# Patient Record
Sex: Female | Born: 1980 | Hispanic: No | Marital: Married | Smoking: Never smoker
Health system: Southern US, Community
[De-identification: ages and names within clinical notes are randomized; demographics above are authoritative.]

## PROBLEM LIST (undated history)

## (undated) ENCOUNTER — Inpatient Hospital Stay (HOSPITAL_COMMUNITY): Payer: Self-pay

## (undated) DIAGNOSIS — O24419 Gestational diabetes mellitus in pregnancy, unspecified control: Secondary | ICD-10-CM

---

## 2013-07-08 ENCOUNTER — Emergency Department (HOSPITAL_COMMUNITY): Payer: Medicaid Other

## 2013-07-08 ENCOUNTER — Encounter (HOSPITAL_COMMUNITY): Payer: Self-pay | Admitting: Emergency Medicine

## 2013-07-08 ENCOUNTER — Emergency Department (HOSPITAL_COMMUNITY)
Admission: EM | Admit: 2013-07-08 | Discharge: 2013-07-08 | Discharge status: Against Medical Advice | Payer: Medicaid Other | Attending: Emergency Medicine | Admitting: Emergency Medicine

## 2013-07-08 DIAGNOSIS — Z8632 Personal history of gestational diabetes: Secondary | ICD-10-CM | POA: Insufficient documentation

## 2013-07-08 DIAGNOSIS — R0789 Other chest pain: Secondary | ICD-10-CM | POA: Insufficient documentation

## 2013-07-08 DIAGNOSIS — M549 Dorsalgia, unspecified: Secondary | ICD-10-CM | POA: Insufficient documentation

## 2013-07-08 DIAGNOSIS — O26899 Other specified pregnancy related conditions, unspecified trimester: Secondary | ICD-10-CM

## 2013-07-08 DIAGNOSIS — O9989 Other specified diseases and conditions complicating pregnancy, childbirth and the puerperium: Secondary | ICD-10-CM | POA: Insufficient documentation

## 2013-07-08 DIAGNOSIS — R079 Chest pain, unspecified: Secondary | ICD-10-CM | POA: Insufficient documentation

## 2013-07-08 DIAGNOSIS — R109 Unspecified abdominal pain: Secondary | ICD-10-CM | POA: Insufficient documentation

## 2013-07-08 DIAGNOSIS — N898 Other specified noninflammatory disorders of vagina: Secondary | ICD-10-CM

## 2013-07-08 HISTORY — DX: Gestational diabetes mellitus in pregnancy, unspecified control: O24.419

## 2013-07-08 LAB — COMPREHENSIVE METABOLIC PANEL
ALK PHOS: 64 U/L (ref 39–117)
ALT: 14 U/L (ref 0–35)
AST: 20 U/L (ref 0–37)
Albumin: 2.5 g/dL — ABNORMAL LOW (ref 3.5–5.2)
BUN: 5 mg/dL — ABNORMAL LOW (ref 6–23)
CHLORIDE: 104 meq/L (ref 96–112)
CO2: 18 meq/L — AB (ref 19–32)
Calcium: 8.8 mg/dL (ref 8.4–10.5)
Creatinine, Ser: 0.43 mg/dL — ABNORMAL LOW (ref 0.50–1.10)
Glucose, Bld: 123 mg/dL — ABNORMAL HIGH (ref 70–99)
POTASSIUM: 3.4 meq/L — AB (ref 3.7–5.3)
SODIUM: 138 meq/L (ref 137–147)
Total Bilirubin: <0.2 mg/dL — ABNORMAL LOW (ref 0.3–1.2)
Total Protein: 6.8 g/dL (ref 6.0–8.3)

## 2013-07-08 LAB — URINALYSIS, ROUTINE W REFLEX MICROSCOPIC
BILIRUBIN URINE: NEGATIVE
Glucose, UA: NEGATIVE mg/dL
Hgb urine dipstick: NEGATIVE
KETONES UR: NEGATIVE mg/dL
Leukocytes, UA: NEGATIVE
Nitrite: NEGATIVE
PH: 5.5 (ref 5.0–8.0)
Protein, ur: NEGATIVE mg/dL
Specific Gravity, Urine: 1.005 (ref 1.005–1.030)
Urobilinogen, UA: 0.2 mg/dL (ref 0.0–1.0)

## 2013-07-08 LAB — CBC WITH DIFFERENTIAL/PLATELET
Basophils Absolute: 0 10*3/uL (ref 0.0–0.1)
Basophils Relative: 0 % (ref 0–1)
Eosinophils Absolute: 0.1 10*3/uL (ref 0.0–0.7)
Eosinophils Relative: 1 % (ref 0–5)
HCT: 39 % (ref 36.0–46.0)
HEMOGLOBIN: 13.9 g/dL (ref 12.0–15.0)
LYMPHS ABS: 1.7 10*3/uL (ref 0.7–4.0)
Lymphocytes Relative: 22 % (ref 12–46)
MCH: 30.3 pg (ref 26.0–34.0)
MCHC: 35.6 g/dL (ref 30.0–36.0)
MCV: 85 fL (ref 78.0–100.0)
MONOS PCT: 6 % (ref 3–12)
Monocytes Absolute: 0.5 10*3/uL (ref 0.1–1.0)
NEUTROS ABS: 5.4 10*3/uL (ref 1.7–7.7)
Neutrophils Relative %: 71 % (ref 43–77)
Platelets: 197 10*3/uL (ref 150–400)
RBC: 4.59 MIL/uL (ref 3.87–5.11)
RDW: 13.6 % (ref 11.5–15.5)
WBC: 7.6 10*3/uL (ref 4.0–10.5)

## 2013-07-08 LAB — HCG, QUANTITATIVE, PREGNANCY: hCG, Beta Chain, Quant, S: 15273 m[IU]/mL — ABNORMAL HIGH (ref <5)

## 2013-07-08 MED ORDER — SODIUM CHLORIDE 0.9 % IV BOLUS (SEPSIS)
1000.0000 mL | Freq: Once | INTRAVENOUS | Status: AC
Start: 2013-07-08 — End: 2013-07-08
  Administered 2013-07-08: 1000 mL via INTRAVENOUS

## 2013-07-08 NOTE — ED Notes (Signed)
Pt c/o left chest pain, low back pain. Pt is approximately [redacted] weeks pregnant. LMP 8/25. Pt reports leaking clear fluid onset yesterday. Pt has not had any prenatal care for this pregnancy. This is pt 5th pregnancy.

## 2013-07-08 NOTE — ED Notes (Signed)
Pt and family states reports that they must leave due to child care issues at home. MD instructed patient to [facility] in the morning to have ultrasound performed. [facility] to have study performed. RN instructed patient to rest tonight and drink plenty of fluids. Pt verbalized understanding of d/c instructions.

## 2013-07-08 NOTE — Progress Notes (Signed)
Spoke with Dr. Macon LargeAnyanwu and told her that the pt has a LMP of Aug 25th 2014 which puts her at 20 5/7 weeks. Pt c/o leaking fluid, no uc's per pt or efm, no fluid seen on perineum. Pt c/o chest pain. Has nl EKG. Fhr 145 BPM, cbc with diff, cmp, drawn. Iv access obtained by ED staff, fluid bolus given. [facility]. Orders received for OB limited U/S to look at the amount of fluid around the baby. If it is normal, she can be dc'd home.

## 2013-07-08 NOTE — Progress Notes (Signed)
Pt c/o leaking fluid and just feeling wet. LMP in Aug. No prenatal care. States [place] this [place]. She says that this is her 5th pregnancy and that she has 3 living children. The last baby had some anomolies and died. Pt's perineum looks dry, but she says she has been feeling wet. No vaginal bleeding noted. Pt denies feeling uc's. Fhr 145BPM. Much fetal movement. Pt is 20 weeks and 5 days.

## 2013-07-08 NOTE — Discharge Instructions (Signed)
Abdominal Pain During Pregnancy °Abdominal pain is common in pregnancy. Most of the time, it does not cause harm. There are many causes of abdominal pain. Some causes are more serious than others. Some of the causes of abdominal pain in pregnancy are easily diagnosed. Occasionally, the diagnosis takes time to understand. Other times, the cause is not determined. Abdominal pain can be a sign that something is very wrong with the pregnancy, or the pain may have nothing to do with the pregnancy at all. For this reason, always tell your health care provider if you have any abdominal discomfort. °HOME CARE INSTRUCTIONS  °Monitor your abdominal pain for any changes. The following actions may help to alleviate any discomfort you are experiencing: °· Do not have sexual intercourse or put anything in your vagina until your symptoms go away completely. °· Get plenty of rest until your pain improves. °· Drink clear fluids if you feel nauseous. Avoid solid food as long as you are uncomfortable or nauseous. °· Only take over-the-counter or prescription medicine as directed by your health care provider. °· Keep all follow-up appointments with your health care provider. °SEEK IMMEDIATE MEDICAL CARE IF: °· You are bleeding, leaking fluid, or passing tissue from the vagina. °· You have increasing pain or cramping. °· You have persistent vomiting. °· You have painful or bloody urination. °· You have a fever. °· You notice a decrease in your baby's movements. °· You have extreme weakness or feel faint. °· You have shortness of breath, with or without abdominal pain. °· You develop a severe headache with abdominal pain. °· You have abnormal vaginal discharge with abdominal pain. °· You have persistent diarrhea. °· You have abdominal pain that continues even after rest, or gets worse. °MAKE SURE YOU:  °· Understand these instructions. °· Will watch your condition. °· Will get help right away if you are not doing well or get  worse. °Document Released: 06/08/2005 Document Revised: 03/29/2013 Document Reviewed: 01/05/2013 °ExitCare® Patient Information ©2014 [employer]. ° °

## 2013-07-08 NOTE — ED Notes (Signed)
OB RN at the bedside with fetal monitor.

## 2013-07-08 NOTE — ED Provider Notes (Signed)
CSN: [id number]     Arrival date & time 07/08/13  1532 History   First MD Initiated Contact with Patient 07/08/13 1603     Chief Complaint  Patient presents with  . Back Pain  . Chest Pain   (Consider location/radiation/quality/duration/timing/severity/associated sxs/prior Treatment) Patient is a 33 y.o. female presenting with chest pain and female genitourinary complaint. The history is provided by the patient.  Chest Pain Associated symptoms: no diaphoresis, no dizziness, no fever, no headache, no nausea, no numbness, no palpitations, no shortness of breath, not vomiting and no weakness   Female GU Problem This is a new problem. The current episode started yesterday. The problem occurs constantly. The problem has been unchanged. Pertinent negatives include no arthralgias, chest pain, chills, congestion, diaphoresis, fever, headaches, myalgias, nausea, neck pain, numbness, vomiting or weakness. Nothing aggravates the symptoms. She has tried nothing for the symptoms. The treatment provided no relief.    Past Medical History  Diagnosis Date  . Gestational diabetes    No past surgical history on file. No family history on file. History  Substance Use Topics  . Smoking status: Not on file  . Smokeless tobacco: Not on file  . Alcohol Use: Not on file   OB History   Grav Para Term Preterm Abortions TAB SAB Ect Mult Living   1              Review of Systems  Constitutional: Negative for fever, chills and diaphoresis.  HENT: Negative for congestion and rhinorrhea.   Eyes: Negative for redness and visual disturbance.  Respiratory: Negative for shortness of breath and wheezing.   Cardiovascular: Negative for chest pain and palpitations.  Gastrointestinal: Negative for nausea and vomiting.  Genitourinary: Positive for vaginal discharge (clear). Negative for dysuria and urgency.  Musculoskeletal: Negative for arthralgias, myalgias and neck pain.  Skin: Negative for pallor and wound.   Neurological: Negative for dizziness, weakness, numbness and headaches.    Allergies  Review of patient's allergies indicates no known allergies.  Home Medications   Current Outpatient Rx  Name  Route  Sig  Dispense  Refill  . Prenatal Vit-Fe Fumarate-FA (MULTIVITAMIN-PRENATAL) 27-0.8 MG TABS tablet   Oral   Take 1 tablet by mouth daily at 12 noon.          BP 114/85  Pulse 76  Temp(Src) 98.3 F (36.8 C) (Oral)  Resp 15  SpO2 100%  LMP 02/13/2013 Physical Exam  Constitutional: She is oriented to person, place, and time. She appears well-developed and well-nourished. No distress.  HENT:  Head: Normocephalic and atraumatic.  Eyes: EOM are normal. Pupils are equal, round, and reactive to light.  Neck: Normal range of motion. Neck supple.  Cardiovascular: Normal rate and regular rhythm.  Exam reveals no gallop and no friction rub.   No murmur heard. Pulmonary/Chest: Effort normal. She has no wheezes. She has no rales.  Abdominal: Soft. She exhibits no distension. There is no tenderness. There is no rebound and no guarding.  Gravid uterus   Musculoskeletal: She exhibits no edema and no tenderness.  Neurological: She is alert and oriented to person, place, and time.  Skin: Skin is warm and dry. She is not diaphoretic.  Psychiatric: She has a normal mood and affect. Her behavior is normal.    ED Course  Procedures (including critical care time) Labs Review Labs Reviewed  COMPREHENSIVE METABOLIC PANEL - Abnormal; Notable for the following:    Potassium 3.4 (*)    CO2 18 (*)  Glucose, Bld 123 (*)    BUN 5 (*)    Creatinine, Ser 0.43 (*)    Albumin 2.5 (*)    Total Bilirubin <0.2 (*)    All other components within normal limits  CBC WITH DIFFERENTIAL  HCG, QUANTITATIVE, PREGNANCY  URINALYSIS, ROUTINE W REFLEX MICROSCOPIC   Imaging Review No results found.  EKG Interpretation    Date/Time:  Saturday July 08 2013 16:06:30 EST Ventricular Rate:  80 PR  Interval:  135 QRS Duration: 85 QT Interval:  367 QTC Calculation: 423 R Axis:   78 Text Interpretation:  Sinus rhythm Normal ECG Confirmed by BEATON  MD, ROBERT ([id number]) on 07/08/2013 4:09:33 PM            MDM  No diagnosis found. Patient is a 33 y.o. female who presents with Back Pain and Chest Pain with leaking of clear fluid.  This started this started yesterday.  LMP per family 8/25.  [id number].  Last birth complicated with ancephaly and gestational DM.    OB nurse contacted, likely transfer.  Patient with fetal heart beat on bedside ultrasound, will obtain formal ultrasound to rule out oligohydraminos.    Family feeling impatient about having to stay so long for the ultrasound.  Reasoning for family discussed and given the option to follow up with their OB tomorrow for ultrasound.  Family would prefer to do that, no cramping in the ED, patient signed out AMA.   I have discussed the diagnosis/risks/treatment options with the patient and family and believe the pt to be eligible for discharge home to follow-up with OB. We also discussed returning to the ED immediately if new or worsening sx occur. We discussed the sx which are most concerning (e.g., worsening cramping, vaginal bleeding, change in vision, headache) that necessitate immediate return. Any new prescriptions provided to the patient are listed below.  Discharge Medication List as of 07/08/2013  8:59 PM          Melene Plan, MD 07/08/13 2231

## 2013-07-08 NOTE — Progress Notes (Signed)
Spoke with Dr. Macon LargeAnyanwu. U/S has not been done yet. States pt is cleared obstetrically and [facility] Spoke with Dr. Radford PaxBeaton. Gave him Dr. Mont DuttonAnyanwu's phone [phone] and told him to call her if the fluid level was abnormal, otherwise the pt could be dc'd home.

## 2013-07-09 ENCOUNTER — Inpatient Hospital Stay (HOSPITAL_COMMUNITY)
Admission: AD | Admit: 2013-07-09 | Discharge: 2013-07-09 | Disposition: A | Discharge status: Home / Self Care | Payer: Medicaid Other | Source: Ambulatory Visit | Attending: Obstetrics & Gynecology | Admitting: Obstetrics & Gynecology

## 2013-07-09 ENCOUNTER — Encounter (HOSPITAL_COMMUNITY): Payer: Self-pay | Admitting: Family

## 2013-07-09 ENCOUNTER — Inpatient Hospital Stay (HOSPITAL_COMMUNITY): Payer: Medicaid Other

## 2013-07-09 DIAGNOSIS — O0930 Supervision of pregnancy with insufficient antenatal care, unspecified trimester: Secondary | ICD-10-CM | POA: Insufficient documentation

## 2013-07-09 DIAGNOSIS — O99891 Other specified diseases and conditions complicating pregnancy: Secondary | ICD-10-CM | POA: Insufficient documentation

## 2013-07-09 DIAGNOSIS — O9989 Other specified diseases and conditions complicating pregnancy, childbirth and the puerperium: Principal | ICD-10-CM

## 2013-07-09 DIAGNOSIS — O0932 Supervision of pregnancy with insufficient antenatal care, second trimester: Secondary | ICD-10-CM

## 2013-07-09 DIAGNOSIS — M549 Dorsalgia, unspecified: Secondary | ICD-10-CM | POA: Insufficient documentation

## 2013-07-09 LAB — DIFFERENTIAL
Basophils Absolute: 0 10*3/uL (ref 0.0–0.1)
Basophils Relative: 0 % (ref 0–1)
EOS ABS: 0.1 10*3/uL (ref 0.0–0.7)
Eosinophils Relative: 1 % (ref 0–5)
LYMPHS ABS: 1.8 10*3/uL (ref 0.7–4.0)
Lymphocytes Relative: 24 % (ref 12–46)
MONOS PCT: 7 % (ref 3–12)
Monocytes Absolute: 0.5 10*3/uL (ref 0.1–1.0)
NEUTROS PCT: 69 % (ref 43–77)
Neutro Abs: 5.3 10*3/uL (ref 1.7–7.7)

## 2013-07-09 LAB — TYPE AND SCREEN
ABO/RH(D): O POS
ANTIBODY SCREEN: NEGATIVE

## 2013-07-09 LAB — ABO/RH: ABO/RH(D): O POS

## 2013-07-09 LAB — HEPATITIS B SURFACE ANTIGEN: Hepatitis B Surface Ag: NEGATIVE

## 2013-07-09 LAB — RPR: RPR Ser Ql: NONREACTIVE

## 2013-07-09 LAB — RAPID HIV SCREEN (WH-MAU): SUDS RAPID HIV SCREEN: NONREACTIVE

## 2013-07-09 MED ORDER — PRENATAL MULTIVITAMIN CH: 1.0000 | ORAL_TABLET | Freq: Every day | ORAL | Status: AC

## 2013-07-09 NOTE — Discharge Instructions (Signed)
Second Trimester of Pregnancy The second trimester is from week 13 through week 28, months 4 through 6. The second trimester is often a time when you feel your best. Your body has also adjusted to being pregnant, and you begin to feel better physically. Usually, morning sickness has lessened or quit completely, you may have more energy, and you may have an increase in appetite. The second trimester is also a time when the fetus is growing rapidly. At the end of the sixth month, the fetus is about 9 inches long and weighs about 1 pounds. You will likely begin to feel the baby move (quickening) between 18 and 20 weeks of the pregnancy. BODY CHANGES Your body goes through many changes during pregnancy. The changes vary from woman to woman.   Your weight will continue to increase. You will notice your lower abdomen bulging out.  You may begin to get stretch marks on your hips, abdomen, and breasts.  You may develop headaches that can be relieved by medicines approved by your caregiver.  You may urinate more often because the fetus is pressing on your bladder.  You may develop or continue to have heartburn as a result of your pregnancy.  You may develop constipation because certain hormones are causing the muscles that push waste through your intestines to slow down.  You may develop hemorrhoids or swollen, bulging veins (varicose veins).  You may have back pain because of the weight gain and pregnancy hormones relaxing your joints between the bones in your pelvis and as a result of a shift in weight and the muscles that support your balance.  Your breasts will continue to grow and be tender.  Your gums may bleed and may be sensitive to brushing and flossing.  Dark spots or blotches (chloasma, mask of pregnancy) may develop on your face. This will likely fade after the baby is born.  A dark line from your belly button to the pubic area (linea nigra) may appear. This will likely fade after the  baby is born. WHAT TO EXPECT AT YOUR PRENATAL VISITS During a routine prenatal visit:  You will be weighed to make sure you and the fetus are growing normally.  Your blood pressure will be taken.  Your abdomen will be measured to track your baby's growth.  The fetal heartbeat will be listened to.  Any test results from the previous visit will be discussed. Your caregiver may ask you:  How you are feeling.  If you are feeling the baby move.  If you have had any abnormal symptoms, such as leaking fluid, bleeding, severe headaches, or abdominal cramping.  If you have any questions. Other tests that may be performed during your second trimester include:  Blood tests that check for:  Low iron levels (anemia).  Gestational diabetes (between 24 and 28 weeks).  Rh antibodies.  Urine tests to check for infections, diabetes, or protein in the urine.  An ultrasound to confirm the proper growth and development of the baby.  An amniocentesis to check for possible genetic problems.  Fetal screens for spina bifida and Down syndrome. HOME CARE INSTRUCTIONS   Avoid all smoking, herbs, alcohol, and unprescribed drugs. These chemicals affect the formation and growth of the baby.  Follow your caregiver's instructions regarding medicine use. There are medicines that are either safe or unsafe to take during pregnancy.  Exercise only as directed by your caregiver. Experiencing uterine cramps is a good sign to stop exercising.  Continue to eat regular,   healthy meals.  Wear a good support bra for breast tenderness.  Do not use hot tubs, steam rooms, or saunas.  Wear your seat belt at all times when driving.  Avoid raw meat, uncooked cheese, cat litter boxes, and soil used by cats. These carry germs that can cause birth defects in the baby.  Take your prenatal vitamins.  Try taking a stool softener (if your caregiver approves) if you develop constipation. Eat more high-fiber foods,  such as fresh vegetables or fruit and whole grains. Drink plenty of fluids to keep your urine clear or pale yellow.  Take warm sitz baths to soothe any pain or discomfort caused by hemorrhoids. Use hemorrhoid cream if your caregiver approves.  If you develop varicose veins, wear support hose. Elevate your feet for 15 minutes, 3 4 times a day. Limit salt in your diet.  Avoid heavy lifting, wear low heel shoes, and practice good posture.  Rest with your legs elevated if you have leg cramps or low back pain.  Visit your dentist if you have not gone yet during your pregnancy. Use a soft toothbrush to brush your teeth and be gentle when you floss.  A sexual relationship may be continued unless your caregiver directs you otherwise.  Continue to go to all your prenatal visits as directed by your caregiver. SEEK MEDICAL CARE IF:   You have dizziness.  You have mild pelvic cramps, pelvic pressure, or nagging pain in the abdominal area.  You have persistent nausea, vomiting, or diarrhea.  You have a bad smelling vaginal discharge.  You have pain with urination. SEEK IMMEDIATE MEDICAL CARE IF:   You have a fever.  You are leaking fluid from your vagina.  You have spotting or bleeding from your vagina.  You have severe abdominal cramping or pain.  You have rapid weight gain or loss.  You have shortness of breath with chest pain.  You notice sudden or extreme swelling of your face, hands, ankles, feet, or legs.  You have not felt your baby move in over an hour.  You have severe headaches that do not go away with medicine.  You have vision changes. Document Released: 06/02/2001 Document Revised: 02/08/2013 Document Reviewed: 08/09/2012 ExitCare Patient Information 2014 [employer].  

## 2013-07-09 NOTE — MAU Note (Addendum)
33 yo, G5P3 at 6151w6d, [facility] were told to come here for follow-up. Patient denies pain, VB, contractions.  Patient has had no prenatal care to date. Report she has a clinic appointment scheduled for 2/6 at 0800.

## 2013-07-09 NOTE — MAU Provider Note (Signed)
None     Chief Complaint:  No chief complaint on file.   Jadi Cherry is  33 y.o. G5P4 at 1610w6d presents complaining of No chief complaint on file. presents for follow up of her ED visit yesterday. Pt was seen and evaluated for back pain and leakage of fluid x1 2 days ago that was clear per report without loss of fluid since. Pt back pain at baseline and unchanged. Pt with hx of oligohydramnios at the end of last pregnancy and is very concerned about fluid status.   [id number]  Obstetrical/Gynecological History: OB History   Grav Para Term Preterm Abortions TAB SAB Ect Mult Living   5 4        3      Past Medical History: Past Medical History  Diagnosis Date  . Gestational diabetes     Past Surgical History: Past Surgical History  Procedure Laterality Date  . Cesarean section      Family History: History reviewed. No pertinent family history.  Social History: History  Substance Use Topics  . Smoking status: Never Smoker   . Smokeless tobacco: Never Used  . Alcohol Use: No    Allergies: No Known Allergies  Meds:  Prescriptions prior to admission  Medication Sig Dispense Refill  . [DISCONTINUED] Prenatal Vit-Fe Fumarate-FA (PRENATAL MULTIVITAMIN) TABS tablet Take 1 tablet by mouth daily at 12 noon.        Review of Systems -   Review of Systems  No ctx, ?lof 2days ago, no vb, no ctx. No f/c, sob, n/v, d/c, weakness, cp, or other complaints.   Physical Exam  Blood pressure 113/67, pulse 82, temperature 97.9 F (36.6 C), temperature source Oral, resp. rate 17, last menstrual period 02/13/2013. GENERAL: Well-developed, well-nourished female in no acute distress.  LUNGS: Clear to auscultation bilaterally.  HEART: Regular rate and rhythm. ABDOMEN: Soft, nontender, nondistended, gravid.  EXTREMITIES: Nontender, no edema, 2+ distal pulses.  SSE: minimal white discharge. No bleeding. No evidence of LOF  Presentation:  transverse FHT:  Baseline rate 154 bpm doppler Bedside US with appropriate appearing fluid, transverse infant, FHT 157 dates  Labs: Results for orders placed during the hospital encounter of 07/09/13 (from the past 24 hour(s))  DIFFERENTIAL   Collection Time    07/09/13  1:47 PM      Result Value Range   Neutrophils Relative % 69  43 - 77 %   Neutro Abs 5.3  1.7 - 7.7 K/uL   Lymphocytes Relative 24  12 - 46 %   Lymphs Abs 1.8  0.7 - 4.0 K/uL   Monocytes Relative 7  3 - 12 %   Monocytes Absolute 0.5  0.1 - 1.0 K/uL   Eosinophils Relative 1  0 - 5 %   Eosinophils Absolute 0.1  0.0 - 0.7 K/uL   Basophils Relative 0  0 - 1 %   Basophils Absolute 0.0  0.0 - 0.1 K/uL  RAPID HIV SCREEN [facility])   Collection Time    07/09/13  1:47 PM      Result Value Range   SUDS Rapid HIV Screen NON REACTIVE  NON REACTIVE  TYPE AND SCREEN   Collection Time    07/09/13  1:47 PM      Result Value Range   ABO/RH(D) O POS     Antibody Screen PENDING     Sample Expiration 07/12/2013    Results for orders placed during the hospital encounter of 07/08/13 (  from the past 24 hour(s))  CBC WITH DIFFERENTIAL   Collection Time    07/08/13  4:10 PM      Result Value Range   WBC 7.6  4.0 - 10.5 K/uL   RBC 4.59  3.87 - 5.11 MIL/uL   Hemoglobin 13.9  12.0 - 15.0 g/dL   HCT 16.1  09.6 - 04.5 %   MCV 85.0  78.0 - 100.0 fL   MCH 30.3  26.0 - 34.0 pg   MCHC 35.6  30.0 - 36.0 g/dL   RDW 40.9  81.1 - 91.4 %   Platelets 197  150 - 400 K/uL   Neutrophils Relative % 71  43 - 77 %   Neutro Abs 5.4  1.7 - 7.7 K/uL   Lymphocytes Relative 22  12 - 46 %   Lymphs Abs 1.7  0.7 - 4.0 K/uL   Monocytes Relative 6  3 - 12 %   Monocytes Absolute 0.5  0.1 - 1.0 K/uL   Eosinophils Relative 1  0 - 5 %   Eosinophils Absolute 0.1  0.0 - 0.7 K/uL   Basophils Relative 0  0 - 1 %   Basophils Absolute 0.0  0.0 - 0.1 K/uL  COMPREHENSIVE METABOLIC PANEL   Collection Time    07/08/13  4:10 PM      Result Value Range   Sodium 138   137 - 147 mEq/L   Potassium 3.4 (*) 3.7 - 5.3 mEq/L   Chloride 104  96 - 112 mEq/L   CO2 18 (*) 19 - 32 mEq/L   Glucose, Bld 123 (*) 70 - 99 mg/dL   BUN 5 (*) 6 - 23 mg/dL   Creatinine, Ser 7.82 (*) 0.50 - 1.10 mg/dL   Calcium 8.8  8.4 - 95.6 mg/dL   Total Protein 6.8  6.0 - 8.3 g/dL   Albumin 2.5 (*) 3.5 - 5.2 g/dL   AST 20  0 - 37 U/L   ALT 14  0 - 35 U/L   Alkaline Phosphatase 64  39 - 117 U/L   Total Bilirubin <0.2 (*) 0.3 - 1.2 mg/dL   GFR calc non Af Amer >90  >90 mL/min   GFR calc Af Amer >90  >90 mL/min  HCG, QUANTITATIVE, PREGNANCY   Collection Time    07/08/13  4:16 PM      Result Value Range   hCG, Beta Chain, Quant, S 15273 (*) <5 mIU/mL  URINALYSIS, ROUTINE W REFLEX MICROSCOPIC   Collection Time    07/08/13  7:34 PM      Result Value Range   Color, Urine YELLOW  YELLOW   APPearance CLEAR  CLEAR   Specific Gravity, Urine 1.005  1.005 - 1.030   pH 5.5  5.0 - 8.0   Glucose, UA NEGATIVE  NEGATIVE mg/dL   Hgb urine dipstick NEGATIVE  NEGATIVE   Bilirubin Urine NEGATIVE  NEGATIVE   Ketones, ur NEGATIVE  NEGATIVE mg/dL   Protein, ur NEGATIVE  NEGATIVE mg/dL   Urobilinogen, UA 0.2  0.0 - 1.0 mg/dL   Nitrite NEGATIVE  NEGATIVE   Leukocytes, UA NEGATIVE  NEGATIVE   Imaging Studies:  No results found.  Assessment: Cleaster Folta is  33 y.o. G5P4 at [redacted]w[redacted]d presents with back pain likely related to pregnancy and ?LOF several days ago. Bedside US reassuring, but evaluated with formal limited US because of plan from ED. Formal US reassuring. Otherwise no acute issues..  Plan: Pt to follow up with clinic on  3Feb, PNL drawn today, US ob 14+ coordinated today as well. PTL Precautions reviewed. Discharged home.  Jolyn Lent RYAN 1/18/20152:27 PM

## 2013-07-10 LAB — GC/CHLAMYDIA PROBE AMP
CT PROBE, AMP APTIMA: NEGATIVE
GC PROBE AMP APTIMA: NEGATIVE

## 2013-07-12 NOTE — ED Provider Notes (Signed)
I saw and evaluated the patient, reviewed the resident's note and I agree with the findings and plan.   .Face to face Exam:  General:  Awake HEENT:  Atraumatic Resp:  Normal effort Abd:  Nondistended Neuro:No focal weakness   Nelia Shiobert L Bird Tailor, MD 07/12/13 [phone]

## 2013-07-31 ENCOUNTER — Encounter: Payer: Self-pay | Admitting: Family Medicine

## 2013-08-07 ENCOUNTER — Other Ambulatory Visit (HOSPITAL_COMMUNITY)
Admission: RE | Admit: 2013-08-07 | Discharge: 2013-08-07 | Disposition: A | Discharge status: Home / Self Care | Payer: Medicaid Other | Source: Ambulatory Visit | Attending: Obstetrics & Gynecology | Admitting: Obstetrics & Gynecology

## 2013-08-07 ENCOUNTER — Encounter: Payer: Self-pay | Admitting: Obstetrics & Gynecology

## 2013-08-07 ENCOUNTER — Ambulatory Visit (INDEPENDENT_AMBULATORY_CARE_PROVIDER_SITE_OTHER): Payer: Medicaid Other | Admitting: Obstetrics & Gynecology

## 2013-08-07 VITALS — BP 119/82 | Temp 98.8°F | Ht 63.0 in | Wt 210.4 lb

## 2013-08-07 DIAGNOSIS — Z23 Encounter for immunization: Secondary | ICD-10-CM

## 2013-08-07 DIAGNOSIS — O2390 Unspecified genitourinary tract infection in pregnancy, unspecified trimester: Secondary | ICD-10-CM

## 2013-08-07 DIAGNOSIS — Z01419 Encounter for gynecological examination (general) (routine) without abnormal findings: Secondary | ICD-10-CM | POA: Insufficient documentation

## 2013-08-07 DIAGNOSIS — N390 Urinary tract infection, site not specified: Secondary | ICD-10-CM

## 2013-08-07 DIAGNOSIS — O34219 Maternal care for unspecified type scar from previous cesarean delivery: Secondary | ICD-10-CM

## 2013-08-07 DIAGNOSIS — O2342 Unspecified infection of urinary tract in pregnancy, second trimester: Secondary | ICD-10-CM

## 2013-08-07 DIAGNOSIS — Z113 Encounter for screening for infections with a predominantly sexual mode of transmission: Secondary | ICD-10-CM | POA: Insufficient documentation

## 2013-08-07 DIAGNOSIS — O0930 Supervision of pregnancy with insufficient antenatal care, unspecified trimester: Secondary | ICD-10-CM

## 2013-08-07 DIAGNOSIS — Z1151 Encounter for screening for human papillomavirus (HPV): Secondary | ICD-10-CM | POA: Insufficient documentation

## 2013-08-07 LAB — POCT URINALYSIS DIP (DEVICE)
BILIRUBIN URINE: NEGATIVE
Glucose, UA: NEGATIVE mg/dL
Hgb urine dipstick: NEGATIVE
Ketones, ur: NEGATIVE mg/dL
LEUKOCYTES UA: NEGATIVE
NITRITE: NEGATIVE
Protein, ur: NEGATIVE mg/dL
Specific Gravity, Urine: 1.02 (ref 1.005–1.030)
UROBILINOGEN UA: 0.2 mg/dL (ref 0.0–1.0)
pH: 7 (ref 5.0–8.0)

## 2013-08-07 LAB — TSH: TSH: 1.102 u[IU]/mL (ref 0.350–4.500)

## 2013-08-07 LAB — HIV ANTIBODY (ROUTINE TESTING W REFLEX): HIV: NONREACTIVE

## 2013-08-07 MED ORDER — TETANUS-DIPHTH-ACELL PERTUSSIS 5-2.5-18.5 LF-MCG/0.5 IM SUSP: 0.5000 mL | Freq: Once | INTRAMUSCULAR | Status: AC

## 2013-08-07 NOTE — Progress Notes (Signed)
   Subjective:    Erica HillockHiba Black is Black 33 y.o. [id number] at 3328w0d, by LMP consistent with 19 week scan, being seen today for her first obstetrical visit.  Her obstetrical history is significant for two previous cesarean sections; last cesarean delivery was at 36 weeks for Black baby with Down's syndrome and oligohydramnios; delivery was done for worsening oligohydramnios. Infant dies at 343 months of age.  History of GDM, will get 1 hr GTT today.  Patient desires repeat cesarean section. Patient does intend to breast feed. Pregnancy history fully reviewed.  Patient reports no complaints.  Filed Vitals:   08/07/13 0916 08/07/13 0936  BP: 119/82   Temp: 98.8 F (37.1 C)   Height:  5\' 3"  (1.6 m)  Weight: 210 lb 6.4 oz (95.437 kg)     HISTORY: OB History  Gravida Para Term Preterm AB SAB TAB Ectopic Multiple Living  5 4 3 1      4     # Outcome Date GA Lbr Len/2nd Weight Sex Delivery Anes PTL Lv  5 CUR           4 PRE 08/08/12 [redacted]w[redacted]d  4 lb 6.6 oz (2 kg) M LTCS Gen  N     Comments: baby died at 183 mos old; olihydraminos; baby had down's syndrome  3 TRM 12/19/07 2431w0d  5 lb 8.2 oz (2.5 kg) F LTCS Gen  Y     Comments: malpositioned and postdates  2 TRM 05/11/06 7643w0d  6 lb 9.8 oz (3 kg) M SVD None  Y  1 TRM 02/25/04 6943w0d  6 lb 9.8 oz (3 kg) M SVD None  Y     Past Medical History  Diagnosis Date  . Gestational diabetes    Past Surgical History  Procedure Laterality Date  . Cesarean section     History reviewed. No pertinent family history.   Exam   BP 119/82  Temp(Src) 98.8 F (37.1 C)  Ht 5\' 3"  (1.6 m)  Wt 210 lb 6.4 oz (95.437 kg)  BMI 37.28 kg/m2  LMP 02/13/2013 GENERAL: Well-developed, well-nourished female in no acute distress.  HEENT: Normocephalic, atraumatic. Sclerae anicteric.  NECK: Supple. Normal thyroid.  LUNGS: Clear to auscultation bilaterally.  HEART: Regular rate and rhythm. BREASTS: Symmetric in size. No masses, skin changes, nipple drainage, or  lymphadenopathy. ABDOMEN: Soft, nontender, gravid. No organomegaly. 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 Normal introitus, able to accomodate Graves speculum easily.  Vagina is pink and rugated.  Normal discharge. Normal cervix contour. Pap smear obtained. Uterus is 25 weeks in size, nontender. No adnexal mass or tenderness.  EXTREMITIES: No cyanosis, clubbing, or edema, 2+ distal pulses.    Assessment:    Pregnancy: [id number] Patient Active Problem List   Diagnosis Date Noted  . Late prenatal care 08/07/2013  . Previous cesarean delivery x2, antepartum 08/07/2013     Plan:   Initial labs drawn.  Flu and Tdap vaccines given. 1 hr GTT also done today, history of GDM. Continue prenatal vitamins. Patient counseled about RCS vs VBAC; risks reviewed. She desires RCS, will be scheduled at 39 weeks or earlier if indicated Problem list reviewed and updated. Ultrasound discussed; fetal survey: ordered. Follow up in 3 weeks, fetal movement and labor precautions reviewed.   Erica Black,Erica Black, M.D. 08/07/2013

## 2013-08-07 NOTE — Patient Instructions (Signed)
Return to clinic for any obstetric concerns or go to [facility] for evaluation  

## 2013-08-07 NOTE — Progress Notes (Signed)
U/S scheduled 08/11/13 at 9 am. [facility] Patient to get letter from check out to take to her appt.

## 2013-08-07 NOTE — Progress Notes (Signed)
Pulse- 84 Edema-feet Early glucola given for BMI Pt reports having sensitivity with hot/cold things, gums bleed New ob packet given Tdap and flu vaccine consented and info given

## 2013-08-08 DIAGNOSIS — O09219 Supervision of pregnancy with history of pre-term labor, unspecified trimester: Secondary | ICD-10-CM

## 2013-08-08 DIAGNOSIS — O34219 Maternal care for unspecified type scar from previous cesarean delivery: Secondary | ICD-10-CM

## 2013-08-08 DIAGNOSIS — O0930 Supervision of pregnancy with insufficient antenatal care, unspecified trimester: Secondary | ICD-10-CM

## 2013-08-08 LAB — OBSTETRIC PANEL
Antibody Screen: NEGATIVE
BASOS ABS: 0 10*3/uL (ref 0.0–0.1)
BASOS PCT: 0 % (ref 0–1)
Eosinophils Absolute: 0.1 10*3/uL (ref 0.0–0.7)
Eosinophils Relative: 1 % (ref 0–5)
HCT: 40.8 % (ref 36.0–46.0)
Hemoglobin: 14.1 g/dL (ref 12.0–15.0)
Hepatitis B Surface Ag: NEGATIVE
Lymphocytes Relative: 27 % (ref 12–46)
Lymphs Abs: 2.1 10*3/uL (ref 0.7–4.0)
MCH: 29.6 pg (ref 26.0–34.0)
MCHC: 34.6 g/dL (ref 30.0–36.0)
MCV: 85.5 fL (ref 78.0–100.0)
Monocytes Absolute: 0.4 10*3/uL (ref 0.1–1.0)
Monocytes Relative: 5 % (ref 3–12)
NEUTROS ABS: 5.1 10*3/uL (ref 1.7–7.7)
Neutrophils Relative %: 67 % (ref 43–77)
PLATELETS: 211 10*3/uL (ref 150–400)
RBC: 4.77 MIL/uL (ref 3.87–5.11)
RDW: 15.2 % (ref 11.5–15.5)
Rh Type: POSITIVE
Rubella: 2.91 Index — ABNORMAL HIGH (ref <0.90)
WBC: 7.6 10*3/uL (ref 4.0–10.5)

## 2013-08-09 LAB — PRESCRIPTION MONITORING PROFILE (19 PANEL)
AMPHETAMINE/METH: NEGATIVE ng/mL
BUPRENORPHINE, URINE: NEGATIVE ng/mL
Barbiturate Screen, Urine: NEGATIVE ng/mL
Benzodiazepine Screen, Urine: NEGATIVE ng/mL
Cannabinoid Scrn, Ur: NEGATIVE ng/mL
Carisoprodol, Urine: NEGATIVE ng/mL
Cocaine Metabolites: NEGATIVE ng/mL
Creatinine, Urine: 135.71 mg/dL (ref >20.0)
Fentanyl, Ur: NEGATIVE ng/mL
MDMA URINE: NEGATIVE ng/mL
MEPERIDINE UR: NEGATIVE ng/mL
METHAQUALONE SCREEN (URINE): NEGATIVE ng/mL
Methadone Screen, Urine: NEGATIVE ng/mL
Nitrites, Initial: NEGATIVE ug/mL
Opiate Screen, Urine: NEGATIVE ng/mL
Oxycodone Screen, Ur: NEGATIVE ng/mL
PH URINE, INITIAL: 7.4 pH (ref 4.5–8.9)
PROPOXYPHENE: NEGATIVE ng/mL
Phencyclidine, Ur: NEGATIVE ng/mL
TAPENTADOLUR: NEGATIVE ng/mL
TRAMADOL UR: NEGATIVE ng/mL
Zolpidem, Urine: NEGATIVE ng/mL

## 2013-08-09 LAB — GLUCOSE TOLERANCE, 1 HOUR (50G) W/O FASTING: GLUCOSE 1 HOUR GTT: 170 mg/dL — AB (ref 70–140)

## 2013-08-10 ENCOUNTER — Encounter: Payer: Self-pay | Admitting: Obstetrics & Gynecology

## 2013-08-10 DIAGNOSIS — O2342 Unspecified infection of urinary tract in pregnancy, second trimester: Secondary | ICD-10-CM | POA: Insufficient documentation

## 2013-08-10 MED ORDER — SULFAMETHOXAZOLE-TMP DS 800-160 MG PO TABS: 1.0000 | ORAL_TABLET | Freq: Two times a day (BID) | ORAL | Status: AC

## 2013-08-10 NOTE — Addendum Note (Signed)
Addended by: Jaynie CollinsANYANWU, Jarad Barth A on: 08/10/2013 06:24 PM   Modules accepted: Orders

## 2013-08-11 ENCOUNTER — Other Ambulatory Visit (HOSPITAL_COMMUNITY): Payer: Self-pay | Admitting: Family Medicine

## 2013-08-11 ENCOUNTER — Ambulatory Visit (HOSPITAL_COMMUNITY)
Admission: RE | Admit: 2013-08-11 | Discharge: 2013-08-11 | Disposition: A | Discharge status: Home / Self Care | Payer: Medicaid Other | Source: Ambulatory Visit | Attending: Family Medicine | Admitting: Family Medicine

## 2013-08-11 ENCOUNTER — Encounter: Payer: Self-pay | Admitting: Family Medicine

## 2013-08-11 DIAGNOSIS — Z363 Encounter for antenatal screening for malformations: Secondary | ICD-10-CM | POA: Insufficient documentation

## 2013-08-11 DIAGNOSIS — O34219 Maternal care for unspecified type scar from previous cesarean delivery: Secondary | ICD-10-CM | POA: Insufficient documentation

## 2013-08-11 DIAGNOSIS — Z1389 Encounter for screening for other disorder: Secondary | ICD-10-CM | POA: Insufficient documentation

## 2013-08-11 DIAGNOSIS — O09299 Supervision of pregnancy with other poor reproductive or obstetric history, unspecified trimester: Secondary | ICD-10-CM | POA: Insufficient documentation

## 2013-08-11 DIAGNOSIS — O0932 Supervision of pregnancy with insufficient antenatal care, second trimester: Secondary | ICD-10-CM

## 2013-08-11 DIAGNOSIS — O0930 Supervision of pregnancy with insufficient antenatal care, unspecified trimester: Secondary | ICD-10-CM | POA: Insufficient documentation

## 2013-08-11 DIAGNOSIS — O358XX0 Maternal care for other (suspected) fetal abnormality and damage, not applicable or unspecified: Secondary | ICD-10-CM | POA: Insufficient documentation

## 2013-08-13 LAB — CULTURE, OB URINE: Colony Count: >=100000

## 2013-08-14 ENCOUNTER — Telehealth: Payer: Self-pay

## 2013-08-14 NOTE — Telephone Encounter (Signed)
Message copied by Faythe CasaBELLAMY, Keeanna Villafranca M on Mon Aug 14, 2013  3:28 PM ------      Message from: Jaynie CollinsANYANWU, UGONNA A      Created: Thu Aug 10, 2013  6:24 PM       Bactrim prescribed for UTI.  Please call to inform patient of results and advise her to pick up prescription.        ------

## 2013-08-28 ENCOUNTER — Encounter: Payer: Self-pay | Admitting: Family Medicine

## 2013-08-28 ENCOUNTER — Encounter: Payer: Medicaid Other | Admitting: Family Medicine

## 2013-08-29 ENCOUNTER — Encounter: Payer: Self-pay | Admitting: *Deleted

## 2013-10-16 ENCOUNTER — Encounter: Payer: Self-pay | Admitting: *Deleted

## 2013-11-15 ENCOUNTER — Encounter: Payer: Self-pay | Admitting: General Practice

## 2014-04-23 ENCOUNTER — Encounter: Payer: Self-pay | Admitting: Obstetrics & Gynecology

## 2014-05-14 ENCOUNTER — Encounter (HOSPITAL_COMMUNITY): Payer: Self-pay | Admitting: *Deleted

## 2014-07-23 IMAGING — US US OB DETAIL+14 WK
1 series · 12 of 28 positions shown · non-contrast
Comparison: none

[Series 1: us ob comp +14 wk · 12 of 84 slices shown]
[im 4/84]
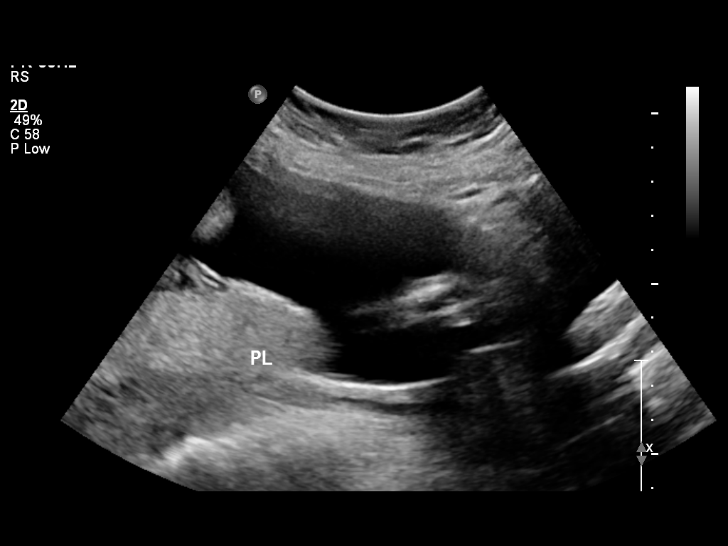
[im 10/84]
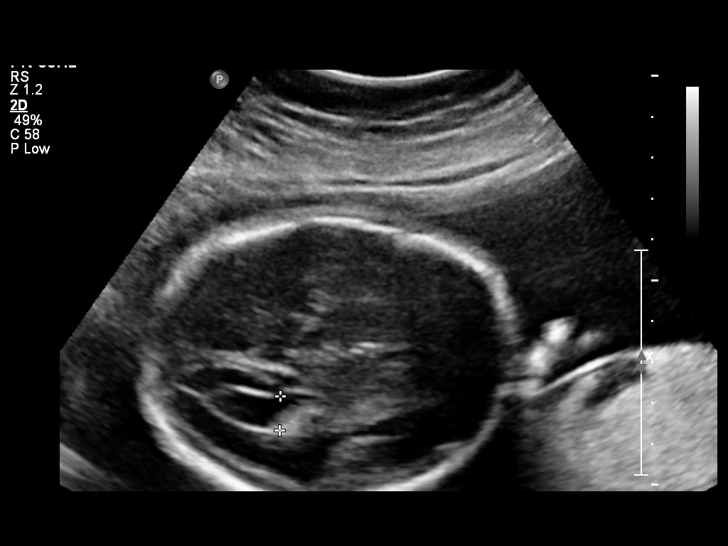
[im 16/84]
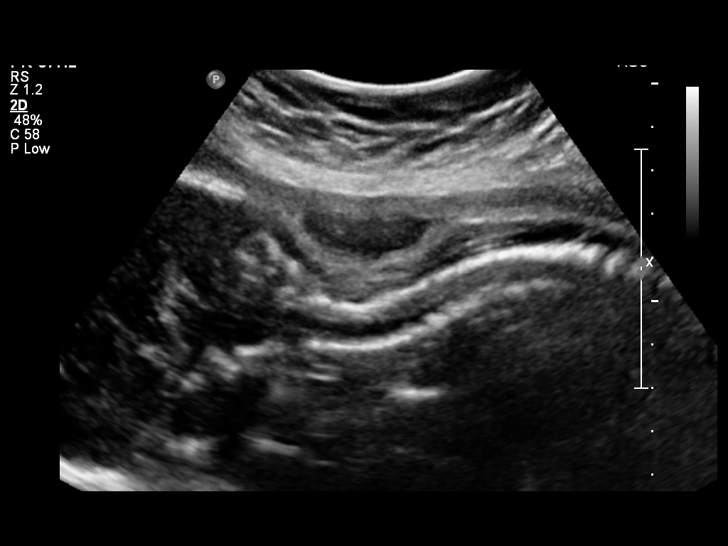
[im 25/84]
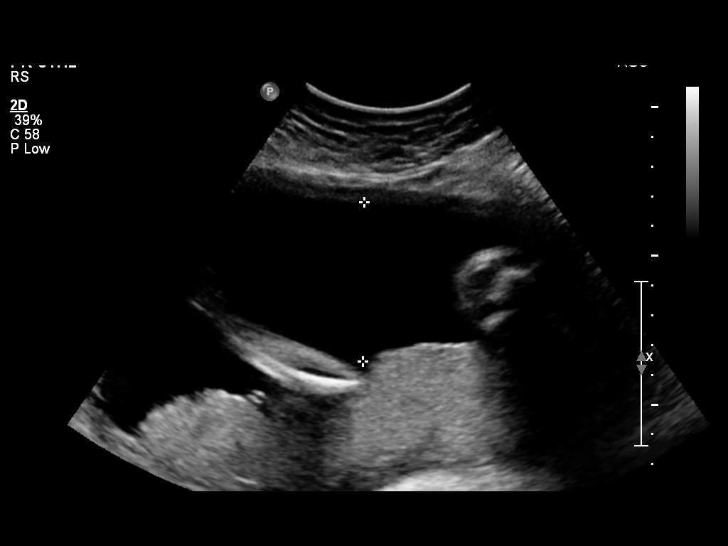
[im 31/84]
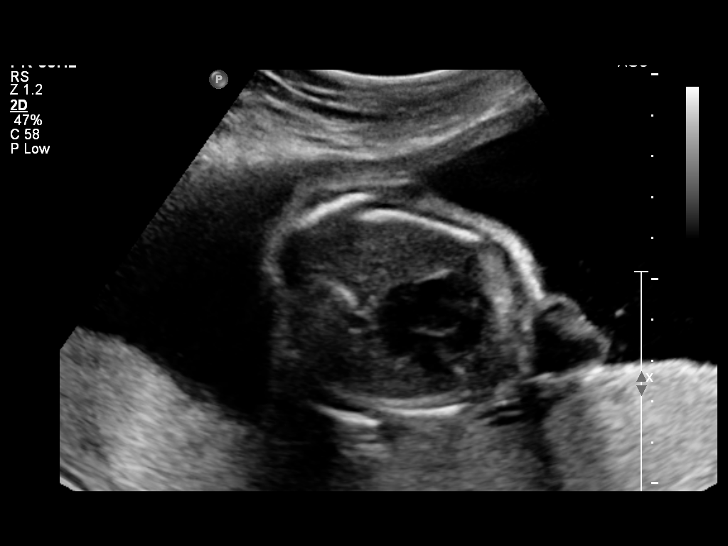
[im 37/84]
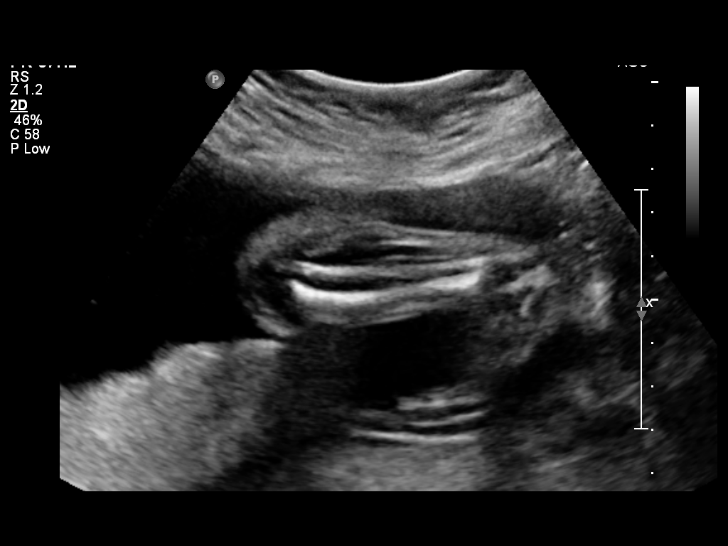
[im 47/84]
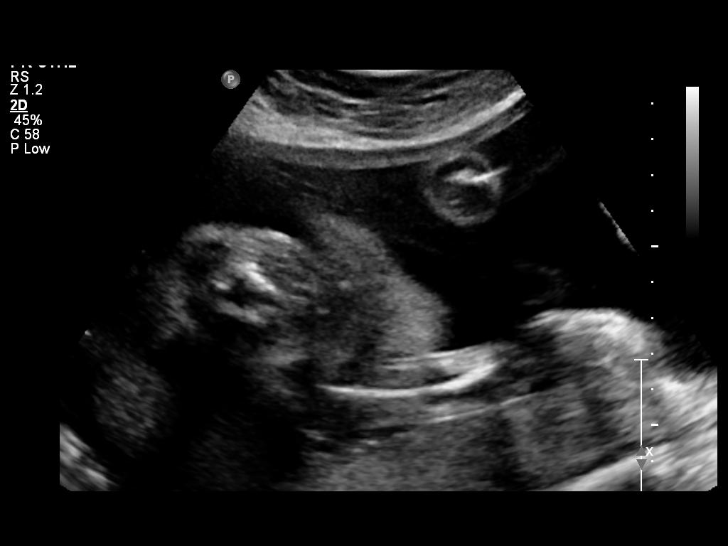
[im 53/84]
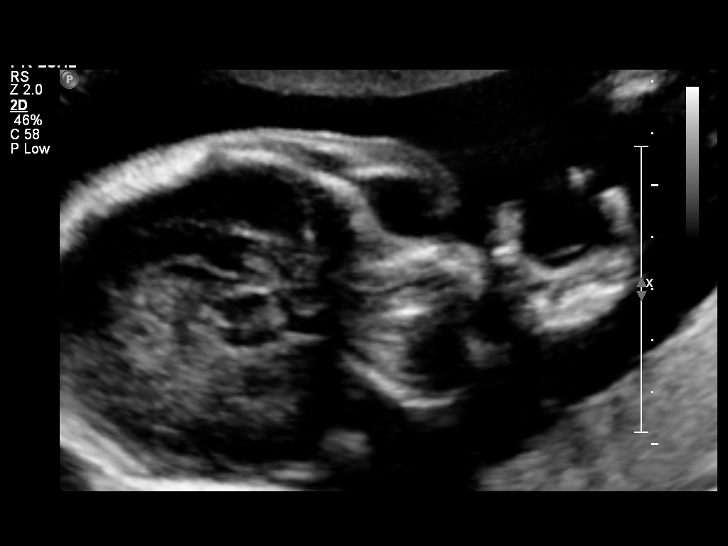
[im 59/84]
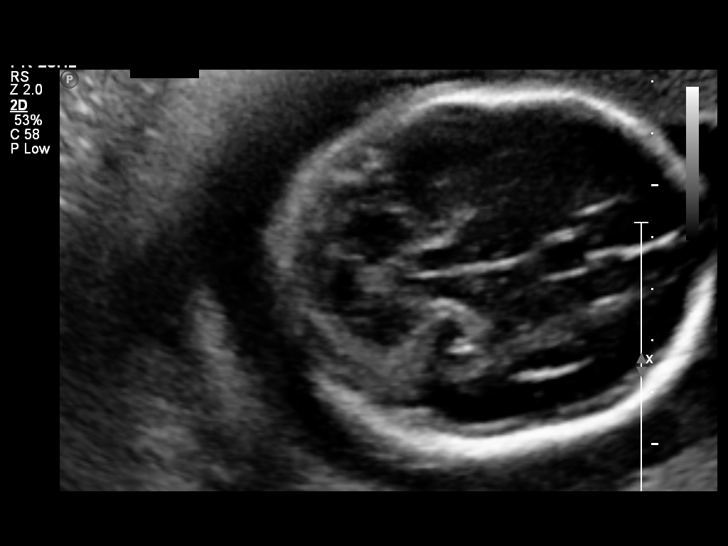
[im 68/84]
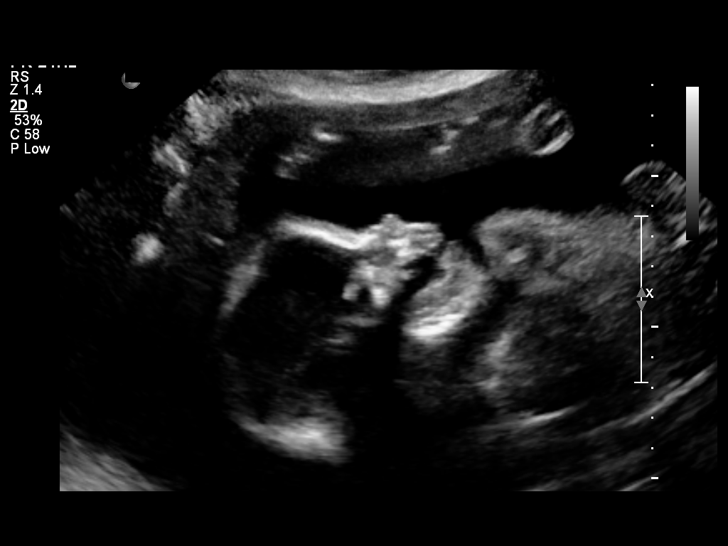
[im 74/84]
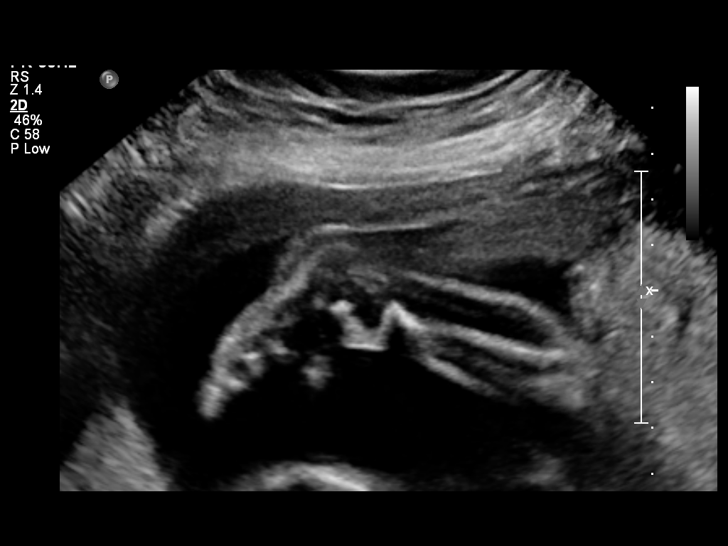
[im 80/84]
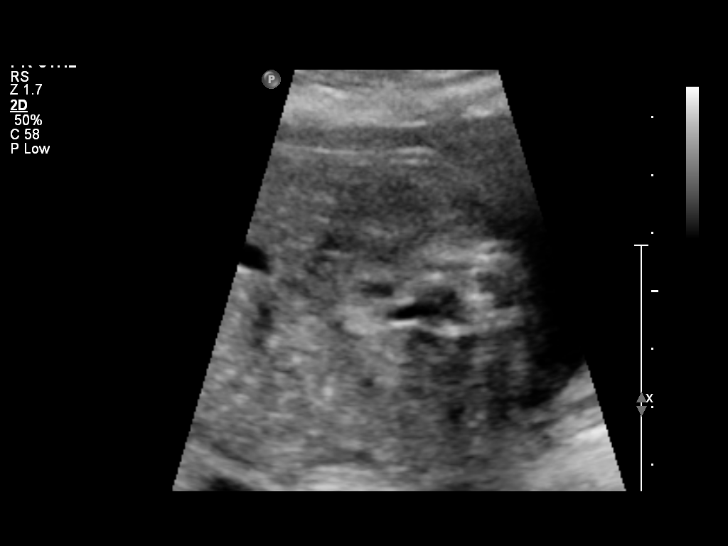

[12 of 28 positions shown; findings below may reference images not displayed]

OBSTETRICS REPORT
                      (Signed Final 08/11/2013 [DATE])

Service(s) Provided

 US OB DETAIL + 14 WK                                  76811.0
Indications

 No or Little Prenatal Care
 Previous cesarean section x2
 Poor obstetric history: Previous gestational
 diabetes
 Poor obstetrical history (Prior Pregnancy Trisomy
 21)
 Detailed fetal anatomic survey
Fetal Evaluation

 Num Of Fetuses:    1
 Fetal Heart Rate:  167                          bpm
 Cardiac Activity:  Observed
 Presentation:      Breech
 Placenta:          Posterior, above cervical
                    os
 P. Cord            Visualized
 Insertion:

 Amniotic Fluid
 AFI FV:      Subjectively within normal limits
                                             Larg Pckt:     5.3  cm
Biometry

 BPD:     67.5  mm     G. Age:  27w 1d                CI:        70.22   70 - 86
                                                      FL/HC:      17.7   18.6 -

 HC:     256.9  mm     G. Age:  27w 6d       93  %    HC/AC:      1.18   1.04 -

 AC:     217.1  mm     G. Age:  26w 1d       60  %    FL/BPD:     67.4   71 - 87
 FL:      45.5  mm     G. Age:  25w 1d       22  %    FL/AC:      21.0   20 - 24

 Est. FW:     880  gm    1 lb 15 oz      62  %
Gestational Age

 LMP:           25w 4d        Date:  02/13/13                 EDD:   11/20/13
 U/S Today:     26w 4d                                        EDD:   11/13/13
 Best:          25w 4d     Det. By:  LMP  (02/13/13)          EDD:   11/20/13
Anatomy

 Cranium:          Appears normal         Aortic Arch:      Not well visualized
 Fetal Cavum:      Appears normal         Ductal Arch:      Not well visualized
 Ventricles:       Appears normal         Diaphragm:        Appears normal
 Choroid Plexus:   Appears normal         Stomach:          Appears normal, left
                                                            sided
 Cerebellum:       Appears normal         Abdomen:          Appears normal
 Posterior Fossa:  Appears normal         Abdominal Wall:   Appears nml (cord
                                                            insert, abd wall)
 Nuchal Fold:      Not applicable (>20    Cord Vessels:     Appears normal (3
                   wks GA)                                  vessel cord)
 Face:             Appears normal         Kidneys:          Appear normal
                   (orbits and profile)
 Lips:             Appears normal         Bladder:          Appears normal
 Heart:            Appears normal         Spine:            Appears normal
                   (4CH, axis, and
                   situs)
 RVOT:             Appears normal         Lower             Appears normal
                                          Extremities:
 LVOT:             Not well visualized    Upper             Appears normal
                                          Extremities:

 Other:  Male gender. Heels and 5th digit visualized. Technically difficult due
         to fetal position.
Cervix Uterus Adnexa

 Cervical Length:    3.1      cm

 Cervix:       Normal appearance by transabdominal scan.

 Adnexa:     No abnormality visualized.
Impression

 Single IUP at 25 [DATE] weeks
 Normal fetal anatomic survey
 Limited views of the fetal heart obtaine due to fetal position
 No markers associated with aneuploidy noted
 Normal amniotic fluid volume
Recommendations

 Recommend follow-up ultrasound examination in 4 weeks for
 growth and to complete anatomy.

 Thank you for sharing in the care of Ms. ARTAN DORSAINVIL with
 questions or concerns.
